# Patient Record
Sex: Female | Born: 2005 | Race: White | Hispanic: Yes | Marital: Single | Smoking: Never smoker
Health system: Southern US, Community
[De-identification: ages and names within clinical notes are randomized; demographics above are authoritative.]

## PROBLEM LIST (undated history)

## (undated) DIAGNOSIS — J309 Allergic rhinitis, unspecified: Secondary | ICD-10-CM

## (undated) DIAGNOSIS — E669 Obesity, unspecified: Secondary | ICD-10-CM

## (undated) DIAGNOSIS — R062 Wheezing: Secondary | ICD-10-CM

## (undated) HISTORY — DX: Wheezing: R06.2

## (undated) HISTORY — DX: Obesity, unspecified: E66.9

## (undated) HISTORY — DX: Allergic rhinitis, unspecified: J30.9

---
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
## 2025-01-01 ENCOUNTER — Other Ambulatory Visit

## 2025-01-02 ENCOUNTER — Other Ambulatory Visit

## 2025-01-04 ENCOUNTER — Other Ambulatory Visit

## 2025-01-11 ENCOUNTER — Other Ambulatory Visit
